# Patient Record
Sex: Male | Born: 1960 | Race: White | Hispanic: No | Marital: Married | Smoking: Never smoker
Health system: Southern US, Community
[De-identification: ages and names within clinical notes are randomized; demographics above are authoritative.]

---

## 2004-09-26 ENCOUNTER — Emergency Department (HOSPITAL_COMMUNITY): Admission: EM | Admit: 2004-09-26 | Discharge: 2004-09-26 | Payer: Self-pay | Admitting: Emergency Medicine
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
## 2020-06-24 ENCOUNTER — Ambulatory Visit: Payer: 59 | Admitting: Dermatology

## 2020-06-24 ENCOUNTER — Other Ambulatory Visit: Payer: Self-pay

## 2020-06-24 ENCOUNTER — Encounter: Payer: Self-pay | Admitting: Dermatology

## 2020-06-24 DIAGNOSIS — D485 Neoplasm of uncertain behavior of skin: Secondary | ICD-10-CM | POA: Diagnosis not present

## 2020-06-24 DIAGNOSIS — L918 Other hypertrophic disorders of the skin: Secondary | ICD-10-CM | POA: Diagnosis not present

## 2020-06-24 DIAGNOSIS — L821 Other seborrheic keratosis: Secondary | ICD-10-CM

## 2020-06-24 DIAGNOSIS — Z1283 Encounter for screening for malignant neoplasm of skin: Secondary | ICD-10-CM

## 2020-06-24 DIAGNOSIS — D1801 Hemangioma of skin and subcutaneous tissue: Secondary | ICD-10-CM

## 2020-06-24 DIAGNOSIS — L738 Other specified follicular disorders: Secondary | ICD-10-CM

## 2020-06-24 DIAGNOSIS — C4491 Basal cell carcinoma of skin, unspecified: Secondary | ICD-10-CM

## 2020-06-24 HISTORY — DX: Basal cell carcinoma of skin, unspecified: C44.91

## 2020-06-24 NOTE — Patient Instructions (Signed)

## 2020-06-30 ENCOUNTER — Telehealth: Payer: Self-pay

## 2020-06-30 NOTE — Telephone Encounter (Signed)
Mail box full.... bcc needs soon treatment no measurements!

## 2020-06-30 NOTE — Telephone Encounter (Signed)
CALLED [facility] WILL RE REVIEW THE SLIDES

## 2020-06-30 NOTE — Telephone Encounter (Signed)
-----   Message from Lavonna Monarch, MD sent at 06/28/2020  4:04 AM EDT ----- This was a surprising result since it looked like a large skin tag and we did not even take a measurement.  Please first call the pathologist and have them review that the Derm path report is correct.If the report is accurate,  try to schedule a 15 to 30-minute surgery ASAP so we can find it in the biopsy site.

## 2020-07-01 ENCOUNTER — Telehealth: Payer: Self-pay

## 2020-07-01 NOTE — Telephone Encounter (Signed)
-----   Message from Lavonna Monarch, MD sent at 06/28/2020  4:04 AM EDT ----- This was a surprising result since it looked like a large skin tag and we did not even take a measurement.  Please first call the pathologist and have them review that the Derm path report is correct.If the report is accurate,  try to schedule a 15 to 30-minute surgery ASAP so we can find it in the biopsy site.

## 2020-07-01 NOTE — Telephone Encounter (Signed)
Phone call to patient with his pathology results. Patient aware of results.  

## 2020-07-09 ENCOUNTER — Ambulatory Visit (INDEPENDENT_AMBULATORY_CARE_PROVIDER_SITE_OTHER): Payer: 59 | Admitting: Dermatology

## 2020-07-09 ENCOUNTER — Other Ambulatory Visit: Payer: Self-pay

## 2020-07-09 ENCOUNTER — Encounter: Payer: Self-pay | Admitting: Dermatology

## 2020-07-09 DIAGNOSIS — C44519 Basal cell carcinoma of skin of other part of trunk: Secondary | ICD-10-CM | POA: Diagnosis not present

## 2020-07-09 NOTE — Patient Instructions (Addendum)
  Biopsy, Surgery (Curettage) & Surgery (Excision) Aftercare Instructions  1. Okay to remove bandage in 24 hours  2. Wash area with soap and water  3. Apply Vaseline to area twice daily until healed (Not Neosporin)  4. Okay to cover with a Band-Aid to decrease the chance of infection or prevent irritation from clothing; also it's okay to uncover lesion at home.  5. Suture instructions: return to our office in 7-10 or 10-14 days for a nurse visit for suture removal. Variable healing with sutures, if pain or itching occurs call our office. It's okay to shower or bathe 24 hours after sutures are given.  6. The following risks may occur after a biopsy, curettage or excision: bleeding, scarring, discoloration, recurrence, infection (redness, yellow drainage, pain or swelling).  7. For questions, concerns and results call our office at [place] before 4pm & Friday before 3pm. Biopsy results will be available in 1 week.   Patient to give the office a call in 2-3 weeks with a follow up report.

## 2020-07-23 ENCOUNTER — Encounter: Payer: Self-pay | Admitting: Dermatology

## 2020-07-23 NOTE — Progress Notes (Signed)
   Follow-Up Visit   Subjective  Lee Diaz is a 59 y.o. male who presents for the following: Procedure (Patient here today for treatment of BCC x 1 on left abdomen (side) upper).  BCC Location: Left abdomen Duration:  Quality:  Associated Signs/Symptoms: Modifying Factors:  Severity:  Timing: Context: Treatment  Objective  Well appearing patient in no apparent distress; mood and affect are within normal limits.  A focused examination was performed including Torso. Relevant physical exam findings are noted in the Assessment and Plan.   Assessment & Plan    Basal cell carcinoma (BCC) of skin of other part of torso Left Abdomen (side) - Upper  Destruction of lesion Complexity: simple   Destruction method: electrodesiccation and curettage   Informed consent: discussed and consent obtained   Timeout:  patient name, date of birth, surgical site, and procedure verified Anesthesia: the lesion was anesthetized in a standard fashion   Anesthetic:  1% lidocaine w/ epinephrine 1-100,000 local infiltration Curettage performed in three different directions: Yes   Curettage cycles:  3 Lesion length (cm):  1 Lesion width (cm):  1 Margin per side (cm):  0 Final wound size (cm):  1 Hemostasis achieved with:  ferric subsulfate Outcome: patient tolerated procedure well with no complications   Post-procedure details: sterile dressing applied and wound care instructions given   Dressing type: bandage and petrolatum   Additional details:  Wound innoculated with 5 fluorouracil solution.     I, Lavonna Monarch, MD, have reviewed all documentation for this visit.  The documentation on 07/23/20 for the exam, diagnosis, procedures, and orders are all accurate and complete.

## 2020-07-25 NOTE — Progress Notes (Signed)
   New Patient   Subjective  Lee Diaz is a 59 y.o. male who presents for the following: Annual Exam (per wife get checked).  Complete skin check Location:  Duration:  Quality:  Associated Signs/Symptoms: Modifying Factors: None Father had melanoma Severity:  Timing: Context: Wife concerned with a spot on his back   The following portions of the chart were reviewed this encounter and updated as appropriate: Tobacco  Allergies  Meds  Problems  Med Hx  Surg Hx  Fam Hx      Objective  Well appearing patient in no apparent distress; mood and affect are within normal limits.  A full examination was performed including scalp, head, eyes, ears, nose, lips, neck, chest, axillae, abdomen, back, buttocks, bilateral upper extremities, bilateral lower extremities, hands, feet, fingers, toes, fingernails, and toenails. All findings within normal limits unless otherwise noted below.   Assessment & Plan  Screening exam for skin cancer Chest - Medial [facility])  Yearly skin check; self examine skin twice annually  Hemangioma of skin (4) Left Abdomen (side) - Lower; Left Lower Back; Right Frontal Scalp; Right Parietal Scalp  Benign okay to leave  Skin tag Right Upper Eyelid  Benign okay to leave  Sebaceous hyperplasia (2) Right Supraorbital Region; Right Lower Eyelid   Benign okay to leave if stable.  Seborrheic keratosis Right Upper Back  Benign okay to leave.  Neoplasm of uncertain behavior of skin Left Abdomen (side) - Upper  Skin / nail biopsy Type of biopsy: tangential   Informed consent: discussed and consent obtained   Timeout: patient name, date of birth, surgical site, and procedure verified   Procedure prep:  Patient was prepped and draped in usual sterile fashion (Non sterile) Prep type:  Chlorhexidine Anesthesia: the lesion was anesthetized in a standard fashion   Anesthetic:  1% lidocaine w/ epinephrine 1-100,000 local infiltration Instrument  used: flexible razor blade   Hemostasis achieved with: electrodesiccation   Outcome: patient tolerated procedure well   Post-procedure details: sterile dressing applied and wound care instructions given   Dressing type: bandage and petrolatum    Specimen 1 - Surgical pathology Differential Diagnosis: R/O NF Check Margins: No cautery

## 2020-10-14 ENCOUNTER — Ambulatory Visit: Payer: 59 | Admitting: Dermatology

## 2024-08-07 ENCOUNTER — Other Ambulatory Visit (HOSPITAL_BASED_OUTPATIENT_CLINIC_OR_DEPARTMENT_OTHER): Payer: Self-pay

## 2024-08-07 MED ORDER — ATORVASTATIN CALCIUM 40 MG PO TABS
40.0000 mg | ORAL_TABLET | Freq: Every day | ORAL | 3 refills | Status: AC
  Filled 2024-08-07: qty 90, 90d supply, fill #0
